# Patient Record
Sex: Male | Born: 1945 | Race: White | Hispanic: No | Marital: Married | Smoking: Never smoker
Health system: Southern US, Community
[De-identification: ages and names within clinical notes are randomized; demographics above are authoritative.]

## PROBLEM LIST (undated history)

## (undated) DIAGNOSIS — C61 Malignant neoplasm of prostate: Secondary | ICD-10-CM

## (undated) DIAGNOSIS — K5730 Diverticulosis of large intestine without perforation or abscess without bleeding: Secondary | ICD-10-CM

## (undated) DIAGNOSIS — I10 Essential (primary) hypertension: Secondary | ICD-10-CM

## (undated) DIAGNOSIS — H43813 Vitreous degeneration, bilateral: Secondary | ICD-10-CM

## (undated) DIAGNOSIS — K5190 Ulcerative colitis, unspecified, without complications: Secondary | ICD-10-CM

## (undated) HISTORY — PX: OTHER SURGICAL HISTORY: SHX169

## (undated) HISTORY — DX: Essential (primary) hypertension: I10

## (undated) HISTORY — DX: Vitreous degeneration, bilateral: H43.813

## (undated) HISTORY — PX: RETROPUBIC PROSTATECTOMY: SUR1055

## (undated) HISTORY — DX: Malignant neoplasm of prostate: C61

## (undated) HISTORY — DX: Ulcerative colitis, unspecified, without complications: K51.90

## (undated) HISTORY — DX: Diverticulosis of large intestine without perforation or abscess without bleeding: K57.30

---

## 2018-03-10 DIAGNOSIS — M818 Other osteoporosis without current pathological fracture: Secondary | ICD-10-CM

## 2018-03-10 HISTORY — DX: Other osteoporosis without current pathological fracture: M81.8

## 2020-02-25 DIAGNOSIS — H2513 Age-related nuclear cataract, bilateral: Secondary | ICD-10-CM

## 2020-02-25 HISTORY — DX: Age-related nuclear cataract, bilateral: H25.13

## 2020-03-04 DIAGNOSIS — E785 Hyperlipidemia, unspecified: Secondary | ICD-10-CM

## 2020-03-04 HISTORY — DX: Hyperlipidemia, unspecified: E78.5

## 2020-04-22 ENCOUNTER — Other Ambulatory Visit: Payer: Self-pay | Admitting: Internal Medicine

## 2020-04-22 ENCOUNTER — Other Ambulatory Visit (HOSPITAL_COMMUNITY): Payer: Self-pay | Admitting: Internal Medicine

## 2020-04-22 DIAGNOSIS — W19XXXS Unspecified fall, sequela: Secondary | ICD-10-CM

## 2020-04-22 DIAGNOSIS — R0781 Pleurodynia: Secondary | ICD-10-CM

## 2020-04-28 ENCOUNTER — Other Ambulatory Visit: Payer: Self-pay

## 2020-04-28 ENCOUNTER — Encounter (INDEPENDENT_AMBULATORY_CARE_PROVIDER_SITE_OTHER): Payer: Self-pay

## 2020-04-28 ENCOUNTER — Ambulatory Visit
Admission: RE | Admit: 2020-04-28 | Discharge: 2020-04-28 | Disposition: A | Discharge status: Home / Self Care | Payer: Medicare PPO | Source: Ambulatory Visit | Attending: Internal Medicine | Admitting: Internal Medicine

## 2020-04-28 DIAGNOSIS — I700 Atherosclerosis of aorta: Secondary | ICD-10-CM | POA: Diagnosis not present

## 2020-04-28 DIAGNOSIS — W19XXXS Unspecified fall, sequela: Secondary | ICD-10-CM | POA: Insufficient documentation

## 2020-04-28 DIAGNOSIS — R0781 Pleurodynia: Secondary | ICD-10-CM

## 2020-04-28 DIAGNOSIS — M7989 Other specified soft tissue disorders: Secondary | ICD-10-CM | POA: Insufficient documentation

## 2020-04-28 DIAGNOSIS — I2510 Atherosclerotic heart disease of native coronary artery without angina pectoris: Secondary | ICD-10-CM | POA: Diagnosis not present

## 2020-04-28 DIAGNOSIS — R609 Edema, unspecified: Secondary | ICD-10-CM | POA: Insufficient documentation

## 2020-05-18 HISTORY — PX: OTHER SURGICAL HISTORY: SHX169

## 2020-06-15 HISTORY — PX: COLONOSCOPY W/ BIOPSIES: SHX1374

## 2020-06-15 HISTORY — PX: ESOPHAGOGASTRODUODENOSCOPY: SHX1529

## 2020-11-01 ENCOUNTER — Other Ambulatory Visit: Payer: Self-pay | Admitting: Physician Assistant

## 2020-11-01 DIAGNOSIS — M25551 Pain in right hip: Secondary | ICD-10-CM

## 2020-11-25 ENCOUNTER — Ambulatory Visit: Payer: Medicare PPO

## 2020-11-26 ENCOUNTER — Other Ambulatory Visit: Payer: Self-pay | Admitting: Physician Assistant

## 2020-11-26 DIAGNOSIS — M25551 Pain in right hip: Secondary | ICD-10-CM

## 2020-11-30 ENCOUNTER — Ambulatory Visit
Admission: RE | Admit: 2020-11-30 | Discharge: 2020-11-30 | Disposition: A | Discharge status: Home / Self Care | Payer: Medicare PPO | Source: Ambulatory Visit | Attending: Physician Assistant | Admitting: Physician Assistant

## 2020-11-30 DIAGNOSIS — M25551 Pain in right hip: Secondary | ICD-10-CM | POA: Diagnosis not present

## 2021-03-22 HISTORY — PX: OTHER SURGICAL HISTORY: SHX169

## 2021-04-06 ENCOUNTER — Other Ambulatory Visit: Payer: Self-pay | Admitting: Sports Medicine

## 2021-04-06 DIAGNOSIS — M7541 Impingement syndrome of right shoulder: Secondary | ICD-10-CM

## 2021-04-06 DIAGNOSIS — M19011 Primary osteoarthritis, right shoulder: Secondary | ICD-10-CM

## 2021-04-06 DIAGNOSIS — M7551 Bursitis of right shoulder: Secondary | ICD-10-CM

## 2021-04-13 ENCOUNTER — Ambulatory Visit
Admission: RE | Admit: 2021-04-13 | Discharge: 2021-04-13 | Disposition: A | Discharge status: Home / Self Care | Payer: Medicare PPO | Source: Ambulatory Visit | Attending: Sports Medicine | Admitting: Sports Medicine

## 2021-04-13 ENCOUNTER — Other Ambulatory Visit: Payer: Self-pay

## 2021-04-13 DIAGNOSIS — M7551 Bursitis of right shoulder: Secondary | ICD-10-CM | POA: Insufficient documentation

## 2021-04-13 DIAGNOSIS — M19011 Primary osteoarthritis, right shoulder: Secondary | ICD-10-CM | POA: Diagnosis present

## 2021-04-13 DIAGNOSIS — M7541 Impingement syndrome of right shoulder: Secondary | ICD-10-CM | POA: Diagnosis not present

## 2021-06-01 ENCOUNTER — Other Ambulatory Visit: Payer: Self-pay | Admitting: Orthopedic Surgery

## 2021-06-01 ENCOUNTER — Other Ambulatory Visit: Payer: Self-pay

## 2021-06-01 ENCOUNTER — Ambulatory Visit
Admission: RE | Admit: 2021-06-01 | Discharge: 2021-06-01 | Disposition: A | Discharge status: Home / Self Care | Payer: Medicare PPO | Source: Ambulatory Visit | Attending: Orthopedic Surgery | Admitting: Orthopedic Surgery

## 2021-06-01 DIAGNOSIS — S82832A Other fracture of upper and lower end of left fibula, initial encounter for closed fracture: Secondary | ICD-10-CM | POA: Insufficient documentation

## 2021-07-18 DIAGNOSIS — L400 Psoriasis vulgaris: Secondary | ICD-10-CM

## 2021-07-18 DIAGNOSIS — L4050 Arthropathic psoriasis, unspecified: Secondary | ICD-10-CM

## 2021-07-18 HISTORY — DX: Psoriasis vulgaris: L40.0

## 2021-07-18 HISTORY — DX: Arthropathic psoriasis, unspecified: L40.50

## 2021-11-02 ENCOUNTER — Other Ambulatory Visit: Payer: Self-pay | Admitting: Rheumatology

## 2021-11-02 DIAGNOSIS — M67911 Unspecified disorder of synovium and tendon, right shoulder: Secondary | ICD-10-CM

## 2021-11-02 DIAGNOSIS — L4050 Arthropathic psoriasis, unspecified: Secondary | ICD-10-CM

## 2021-11-05 DIAGNOSIS — D649 Anemia, unspecified: Secondary | ICD-10-CM | POA: Insufficient documentation

## 2021-11-05 NOTE — Progress Notes (Signed)
?[place]  ?[phone] [phone] ? ?IDKonrad Dolores OB: [birth year]  MR#: [id number]  GQB#:[id number] ? ?Patient Care Team: ?Idelle Crouch, MD as PCP - General (Internal Medicine) ? ?CHIEF COMPLAINT: Anemia. unspecified. ? ?INTERVAL HISTORY: Patient is a 76 year old male who was noted to have a mildly declining hemoglobin recently from 13.6-12.1 on routine blood work.  He is referred for further evaluation.  He currently feels well and is asymptomatic.  He continues to walk on a daily basis.  He has no neurologic complaints.  He denies any recent fevers or illnesses.  He has a good appetite and denies weight loss.  He has no chest pain, shortness of breath, cough, or hemoptysis.  She denies any nausea, vomiting, constipation, or diarrhea.  He has no melena or hematochezia.  He has no urinary complaints.  Patient feels at his baseline and offers no specific complaints today. ? ?REVIEW OF SYSTEMS:   ?Review of Systems  ?Constitutional: Negative.  Negative for fever, malaise/fatigue and weight loss.  ?Respiratory: Negative.  Negative for cough, hemoptysis and shortness of breath.   ?Cardiovascular: Negative.  Negative for chest pain and leg swelling.  ?Gastrointestinal: Negative.  Negative for abdominal pain.  ?Genitourinary: Negative.  Negative for dysuria.  ?Musculoskeletal: Negative.  Negative for back pain.  ?Skin: Negative.  Negative for rash.  ?Neurological: Negative.  Negative for dizziness, focal weakness, weakness and headaches.  ?Psychiatric/Behavioral: Negative.  The patient is not nervous/anxious.   ? ?As per HPI. Otherwise, a complete review of systems is negative. ? ?PAST MEDICAL HISTORY: ?Past Medical History:  ?Diagnosis Date  ? Age-related nuclear cataract of both eyes 02/25/2020  ? Diverticulosis of sigmoid colon   ? Hyperlipidemia 03/04/2020  ? Hypertension   ? Osteoporosis, idiopathic 03/10/2018  ? 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 Posterior vitreous  detachment of both eyes   ? Prostate cancer ([place])   ? Psoriatic arthritis ([place]) 07/18/2021  ? Ulcerative colitis ([place])   ? ? ?PAST SURGICAL HISTORY: ?Past Surgical History:  ?Procedure Laterality Date  ? COLONOSCOPY W/ BIOPSIES  06/15/2020  ? Enthesopathy of right hip  03/22/2021  ? ESOPHAGOGASTRODUODENOSCOPY  06/15/2020  ? history of adenomatous colonic polyps  05/18/2020  ? Rectal fissure repair    ? RETROPUBIC PROSTATECTOMY    ? ? ?FAMILY HISTORY: ?Family History  ?Problem Relation Age of Onset  ? Diabetes Mother   ? Heart disease Mother   ? Arthritis Mother   ? Cancer Father   ?     bladder cancer  ? Diabetes Brother   ? Cancer Brother   ?     Cancer of pharynx  ? Arthritis Maternal Grandmother   ? Arthritis Maternal Grandfather   ? Arthritis Paternal Grandmother   ? Arthritis Paternal Grandfather   ? ? ?ADVANCED DIRECTIVES (Y/N):  N ? ?HEALTH MAINTENANCE: ?Social History  ? ?Tobacco Use  ? Smoking status: Never  ?Substance Use Topics  ? Alcohol use: Not Currently  ? Drug use: Never  ? ? ? Colonoscopy: ? PAP: ? Bone density: ? Lipid panel: ? ?No Known Allergies ? ?Current Outpatient Medications  ?Medication Sig Dispense Refill  ? amLODipine (NORVASC) 10 MG tablet Take 1 tablet by mouth daily.    ? atorvastatin (LIPITOR) 20 MG tablet Take 1 tablet by mouth daily.    ? famotidine (PEPCID) 20 MG tablet TAKE 1 TABLET BY MOUTH TWICE DAILY AS NEEDED FOR HEARTBURN    ? folic acid (FOLVITE) 1 MG tablet Take  by mouth.    ? mesalamine (LIALDA) 1.2 g EC tablet Take by mouth.    ? metFORMIN (GLUCOPHAGE) 1000 MG tablet Take by mouth.    ? methotrexate (RHEUMATREX) 2.5 MG tablet Take by mouth.    ? Multiple Vitamin (MULTI-VITAMIN) tablet Take 1 tablet by mouth daily.    ? omeprazole (PRILOSEC) 40 MG capsule TAKE 1 CAPSULE(40 MG) BY MOUTH EVERY DAY 20 TO 30 MINUTES BEFORE BREAKFAST    ? ?No current facility-administered medications for this visit.  ? ? ?OBJECTIVE: ?Vitals:  ? 11/08/21 1104  ?BP: 129/83  ?Pulse: 88  ?Resp: 16   ?Temp: 98.1 ?F (36.7 ?C)  ?SpO2: 100%  ?   Body mass index is 25.34 kg/m?Marland Kitchen    ECOG FS:0 - Asymptomatic ? ?General: Well-developed, well-nourished, no acute distress. ?Eyes: Pink conjunctiva, anicteric sclera. ?HEENT: Normocephalic, moist mucous membranes. ?Lungs: No audible wheezing or coughing. ?Heart: Regular rate and rhythm. ?Abdomen: Soft, nontender, no obvious distention. ?Musculoskeletal: No edema, cyanosis, or clubbing. ?Neuro: Alert, answering all questions appropriately. Cranial nerves grossly intact. ?Skin: No rashes or petechiae noted. ?Psych: Normal affect. ?Lymphatics: No cervical, calvicular, axillary or inguinal LAD. ? ? ?LAB RESULTS: ? ?No results found for: NA, K, CL, CO2, GLUCOSE, BUN, CREATININE, CALCIUM, PROT, ALBUMIN, AST, ALT, ALKPHOS, BILITOT, GFRNONAA, GFRAA ? ?Lab Results  ?Component Value Date  ? WBC 6.2 11/08/2021  ? HGB 12.5 (L) 11/08/2021  ? HCT 37.0 (L) 11/08/2021  ? MCV 93.9 11/08/2021  ? PLT 226 11/08/2021  ? ? ? ?STUDIES: ?No results found. ? ?ASSESSMENT: Anemia. unspecified. ? ?PLAN:   ? ?Anemia, unspecified: Patient reports he initiated methotrexate for arthritis approximately 2-1/2 months ago.  This could be the possible etiology of his declining hemoglobin.  Today's result is 12.5 which is essentially unchanged.  The remainder of his laboratory work including B12, folate, iron stores, hemolysis labs, SPEP are pending at time of dictation.  No intervention is needed at this time.  Patient does not require bone marrow biopsy.  A video visit was scheduled for 3 weeks to discuss the results, but if everything returns normal this may be canceled. ? ?I spent a total of 45 minutes reviewing chart data, face-to-face evaluation with the patient, counseling and coordination of care as detailed above. ? ? ?Patient expressed understanding and was in agreement with this plan. He also understands that He can call clinic at any time with any questions, concerns, or complaints.  ? ? ?Lloyd Huger, MD   11/08/2021 12:27 PM ? ? ? ? ?

## 2021-11-08 ENCOUNTER — Inpatient Hospital Stay: Payer: Medicare PPO | Attending: Oncology

## 2021-11-08 ENCOUNTER — Encounter: Payer: Self-pay | Admitting: Oncology

## 2021-11-08 ENCOUNTER — Inpatient Hospital Stay: Payer: Medicare PPO | Admitting: Oncology

## 2021-11-08 DIAGNOSIS — Z8546 Personal history of malignant neoplasm of prostate: Secondary | ICD-10-CM | POA: Diagnosis not present

## 2021-11-08 DIAGNOSIS — D649 Anemia, unspecified: Secondary | ICD-10-CM

## 2021-11-08 DIAGNOSIS — Z7984 Long term (current) use of oral hypoglycemic drugs: Secondary | ICD-10-CM | POA: Insufficient documentation

## 2021-11-08 DIAGNOSIS — Z79899 Other long term (current) drug therapy: Secondary | ICD-10-CM | POA: Insufficient documentation

## 2021-11-08 LAB — LACTATE DEHYDROGENASE: LDH: 154 U/L (ref 98–192)

## 2021-11-08 LAB — CBC
HCT: 37 % — ABNORMAL LOW (ref 39.0–52.0)
Hemoglobin: 12.5 g/dL — ABNORMAL LOW (ref 13.0–17.0)
MCH: 31.7 pg (ref 26.0–34.0)
MCHC: 33.8 g/dL (ref 30.0–36.0)
MCV: 93.9 fL (ref 80.0–100.0)
Platelets: 226 10*3/uL (ref 150–400)
RBC: 3.94 MIL/uL — ABNORMAL LOW (ref 4.22–5.81)
RDW: 14.2 % (ref 11.5–15.5)
WBC: 6.2 10*3/uL (ref 4.0–10.5)
nRBC: 0 % (ref 0.0–0.2)

## 2021-11-08 LAB — FERRITIN: Ferritin: 38 ng/mL (ref 24–336)

## 2021-11-08 LAB — IRON AND TIBC
Iron: 46 ug/dL (ref 45–182)
Saturation Ratios: 15 % — ABNORMAL LOW (ref 17.9–39.5)
TIBC: 316 ug/dL (ref 250–450)
UIBC: 270 ug/dL

## 2021-11-08 LAB — VITAMIN B12: Vitamin B-12: 778 pg/mL (ref 180–914)

## 2021-11-08 LAB — FOLATE: Folate: >100 ng/mL (ref >5.9)

## 2021-11-09 LAB — HAPTOGLOBIN: Haptoglobin: 45 mg/dL (ref 34–355)

## 2021-11-10 ENCOUNTER — Ambulatory Visit
Admission: RE | Admit: 2021-11-10 | Discharge: 2021-11-10 | Disposition: A | Discharge status: Home / Self Care | Payer: Medicare PPO | Source: Ambulatory Visit | Attending: Rheumatology | Admitting: Rheumatology

## 2021-11-10 DIAGNOSIS — M67911 Unspecified disorder of synovium and tendon, right shoulder: Secondary | ICD-10-CM | POA: Insufficient documentation

## 2021-11-10 DIAGNOSIS — L4050 Arthropathic psoriasis, unspecified: Secondary | ICD-10-CM | POA: Insufficient documentation

## 2021-11-10 LAB — PROTEIN ELECTROPHORESIS, SERUM
A/G Ratio: 1.5 (ref 0.7–1.7)
Albumin ELP: 4.4 g/dL (ref 2.9–4.4)
Alpha-1-Globulin: 0.3 g/dL (ref 0.0–0.4)
Alpha-2-Globulin: 0.7 g/dL (ref 0.4–1.0)
Beta Globulin: 0.9 g/dL (ref 0.7–1.3)
Gamma Globulin: 1.1 g/dL (ref 0.4–1.8)
Globulin, Total: 2.9 g/dL (ref 2.2–3.9)
Total Protein ELP: 7.3 g/dL (ref 6.0–8.5)

## 2021-11-25 NOTE — Progress Notes (Unsigned)
[place]  Telephone:(336) [phone] [phone]  ID: Todd Patrick OB: [birth year]  MR#: [id number]  VOJ#:[id number]  Patient Care Team: Idelle Crouch, MD as PCP - General (Internal Medicine)  CHIEF COMPLAINT: Mild iron deficiency anemia.  INTERVAL HISTORY: Patient returns to clinic today for further evaluation and discussion of his laboratory results.  He continues to feel well and remains asymptomatic. He has no neurologic complaints.  He denies any recent fevers or illnesses.  He has a good appetite and denies weight loss.  He has no chest pain, shortness of breath, cough, or hemoptysis.  She denies any nausea, vomiting, constipation, or diarrhea.  He has no melena or hematochezia.  He has no urinary complaints.  Patient offers no specific complaints today.  REVIEW OF SYSTEMS:   Review of Systems  Constitutional: Negative.  Negative for fever, malaise/fatigue and weight loss.  Respiratory: Negative.  Negative for cough, hemoptysis and shortness of breath.   Cardiovascular: Negative.  Negative for chest pain and leg swelling.  Gastrointestinal: Negative.  Negative for abdominal pain.  Genitourinary: Negative.  Negative for dysuria.  Musculoskeletal: Negative.  Negative for back pain.  Skin: Negative.  Negative for rash.  Neurological: Negative.  Negative for dizziness, focal weakness, weakness and headaches.  Psychiatric/Behavioral: Negative.  The patient is not nervous/anxious.    As per HPI. Otherwise, a complete review of systems is negative.  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   Posterior vitreous detachment of both eyes    Prostate cancer ([place])    Psoriatic arthritis ([place]) 07/18/2021   Ulcerative colitis ([place])     PAST  SURGICAL HISTORY: Past Surgical History:  Procedure Laterality Date   COLONOSCOPY W/ BIOPSIES  06/15/2020   Enthesopathy of right hip  03/22/2021   ESOPHAGOGASTRODUODENOSCOPY  06/15/2020   history of adenomatous colonic polyps  05/18/2020   Rectal fissure repair     RETROPUBIC PROSTATECTOMY      FAMILY HISTORY: Family History  Problem Relation Age of Onset   Diabetes Mother    Heart disease Mother    Arthritis Mother    Cancer Father        bladder cancer   Diabetes Brother    Cancer Brother        Cancer of pharynx   Arthritis Maternal Grandmother    Arthritis Maternal Grandfather    Arthritis Paternal Grandmother    Arthritis Paternal Grandfather     ADVANCED DIRECTIVES (Y/N):  N  HEALTH MAINTENANCE: Social History   Tobacco Use   Smoking status: Never  Substance Use Topics   Alcohol use: Not Currently   Drug use: Never     Colonoscopy:  PAP:  Bone density:  Lipid panel:  No Known Allergies  Current Outpatient Medications  Medication Sig Dispense Refill   amLODipine (NORVASC) 10 MG tablet Take 1 tablet by mouth daily.     atorvastatin (LIPITOR) 20 MG tablet Take 1 tablet by mouth daily.     folic acid (FOLVITE) 1 MG tablet Take by mouth.     mesalamine (LIALDA) 1.2 g EC tablet Take by mouth.     metFORMIN (GLUCOPHAGE) 1000 MG tablet Take by mouth.     methotrexate (RHEUMATREX) 2.5 MG tablet Take by mouth.     Multiple Vitamin (MULTI-VITAMIN)  tablet Take 1 tablet by mouth daily.     omeprazole (PRILOSEC) 40 MG capsule TAKE 1 CAPSULE(40 MG) BY MOUTH EVERY DAY 20 TO 30 MINUTES BEFORE BREAKFAST     famotidine (PEPCID) 20 MG tablet TAKE 1 TABLET BY MOUTH TWICE DAILY AS NEEDED FOR HEARTBURN (Patient not taking: Reported on 12/01/2021)     No current facility-administered medications for this visit.    OBJECTIVE: Vitals:   12/01/21 1028  BP: 132/69  Pulse: 86  Resp: 18  Temp: 98.9 F (37.2 C)  SpO2: 100%     Body mass index is 25.7 kg/m.    ECOG FS:0 -  Asymptomatic  General: Well-developed, well-nourished, no acute distress. Eyes: Pink conjunctiva, anicteric sclera. HEENT: Normocephalic, moist mucous membranes. Lungs: No audible wheezing or coughing. Heart: Regular rate and rhythm. Abdomen: Soft, nontender, no obvious distention. Musculoskeletal: No edema, cyanosis, or clubbing. Neuro: Alert, answering all questions appropriately. Cranial nerves grossly intact. Skin: No rashes or petechiae noted. Psych: Normal affect.   LAB RESULTS:  No results found for: NA, K, CL, CO2, GLUCOSE, BUN, CREATININE, CALCIUM, PROT, ALBUMIN, AST, ALT, ALKPHOS, BILITOT, GFRNONAA, GFRAA  Lab Results  Component Value Date   WBC 6.2 11/08/2021   HGB 12.5 (L) 11/08/2021   HCT 37.0 (L) 11/08/2021   MCV 93.9 11/08/2021   PLT 226 11/08/2021   Lab Results  Component Value Date   IRON 46 11/08/2021   TIBC 316 11/08/2021   IRONPCTSAT 15 (L) 11/08/2021   Lab Results  Component Value Date   FERRITIN 38 11/08/2021     STUDIES: MR SHOULDER RIGHT WO CONTRAST  Result Date: 11/12/2021 CLINICAL DATA:  Chronic right shoulder pain.  Worse at night. EXAM: MRI OF THE RIGHT SHOULDER WITHOUT CONTRAST TECHNIQUE: Multiplanar, multisequence MR imaging of the shoulder was performed. No intravenous contrast was administered. COMPARISON:  MRI right shoulder 04/13/2021 FINDINGS: Rotator cuff: There is again diffuse intermediate T2 signal supraspinatus and infraspinatus tendinosis, greatest and mild-to-moderate at the far anterior supraspinatus tendon footprint. There is again mild degenerative fraying of the bursal side of the tendon footprint of the interdigitating posterior supraspinatus and anterior infraspinatus. New non fluid-bright intermediate T2 signal linear likely small partial-thickness articular sided tear of the proximal footprint of the mid AP dimension of the supraspinatus tendon (coronal series 7 image 15). No tendon retraction. The subscapularis and teres  minor are intact. Muscles: No rotator cuff muscle atrophy, fatty infiltration, or edema. Biceps long head: Moderate intermediate T2 signal and thickening proximal long head of the biceps tendinosis proximal to the bicipital groove, unchanged. Acromioclavicular Joint: There are mild-to-moderate degenerative changes of the acromioclavicular joint including joint space narrowing, subchondral marrow edema, and peripheral osteophytosis. Type II acromion. Mild subacromial/subdeltoid bursitis. Glenohumeral Joint: Mild thinning of the glenoid and humeral head cartilage. Labrum: Degenerative irregularity and likely tearing of the posterosuperior glenoid labrum, similar to prior. Bones:  No acute fracture. Other: None. IMPRESSION: Compared to 04/13/2021: 1. Redemonstration of supraspinatus greater than infraspinatus tendinosis, moderate at the anterior supraspinatus tendon footprint. New tiny linear partial-thickness articular sided tear of the mid AP dimension of the supraspinatus tendon footprint. No tendon retraction. 2. Moderate proximal long head of the biceps tendinosis, unchanged. 3. Mild-to-moderate degenerative changes of the acromioclavicular joint. 4. 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 Electronically Signed   By: Yvonne Kendall M.D.   On: 11/12/2021 12:11    ASSESSMENT: Mild iron deficiency.  PLAN:    Mild iron deficiency: Patient's most  recent hemoglobin is 12.5 with a mildly decreased iron saturation of 15%.  All of his other laboratory work was either negative or within normal limits.  Patient also recently started methotrexate which may be contributing to his anemia.  No intervention is needed at this time.  Patient does not require bone marrow biopsy.  I recommended oral iron supplementation once per day and have his primary care provider repeat laboratory work in 3 to 4 months.  No further follow-up is necessary.  Please refer patient back  if there are any questions or concerns.    I spent a total of 20 minutes reviewing chart data, face-to-face evaluation with the patient, counseling and coordination of care as detailed above.    Patient expressed understanding and was in agreement with this plan. He also understands that He can call clinic at any time with any questions, concerns, or complaints.    Lloyd Huger, MD   12/01/2021 1:52 PM

## 2021-12-01 ENCOUNTER — Encounter: Payer: Self-pay | Admitting: Oncology

## 2021-12-01 ENCOUNTER — Telehealth: Payer: Medicare PPO | Admitting: Oncology

## 2021-12-01 ENCOUNTER — Inpatient Hospital Stay: Payer: Medicare PPO | Attending: Oncology | Admitting: Oncology

## 2021-12-01 VITALS — BP 132/69 | HR 86 | Temp 98.9°F | Resp 18 | Wt 179.1 lb

## 2021-12-01 DIAGNOSIS — Z79899 Other long term (current) drug therapy: Secondary | ICD-10-CM | POA: Diagnosis not present

## 2021-12-01 DIAGNOSIS — Z8052 Family history of malignant neoplasm of bladder: Secondary | ICD-10-CM | POA: Insufficient documentation

## 2021-12-01 DIAGNOSIS — Z808 Family history of malignant neoplasm of other organs or systems: Secondary | ICD-10-CM | POA: Diagnosis not present

## 2021-12-01 DIAGNOSIS — D509 Iron deficiency anemia, unspecified: Secondary | ICD-10-CM | POA: Insufficient documentation

## 2021-12-01 DIAGNOSIS — D649 Anemia, unspecified: Secondary | ICD-10-CM

## 2022-03-23 ENCOUNTER — Encounter: Payer: Self-pay | Admitting: Oncology

## 2022-03-23 NOTE — Telephone Encounter (Signed)
Spoke with patient in regards to my chart messages. He stated that he has iron infusions set up through Dr. Doy Hutching and will call back if needed for future infusions.

## 2022-10-25 IMAGING — MR MR SHOULDER*R* W/O CM
4 of 5 series · 31 of 40 positions shown · non-contrast
Comparison: MRI right shoulder 04/13/2021

CLINICAL DATA: Chronic right shoulder pain.  Worse at night.

EXAM:
MRI OF THE RIGHT SHOULDER WITHOUT CONTRAST
TECHNIQUE: Multiplanar, multisequence MR imaging of the shoulder was performed.
No intravenous contrast was administered.

[Series 5: T2 fat-sat · axial · right · 4.0mm · 0.44mm/px · z∈[-24,+84]mm · 8 of 26 slices shown (1 of 3)]
[im 1/26]
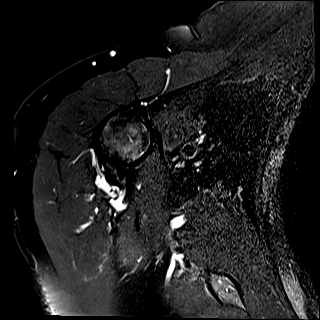
[im 4/26]
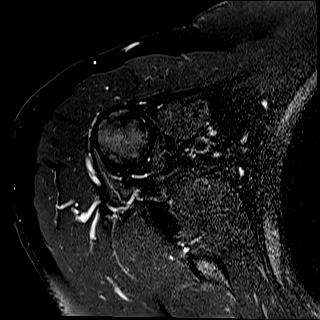
[im 8/26]
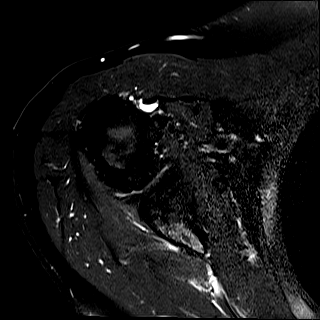
[im 11/26]
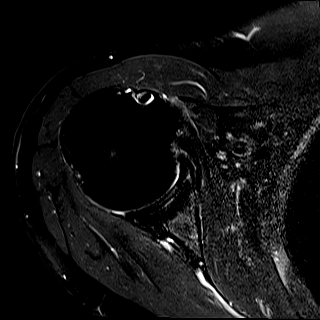
[im 15/26]
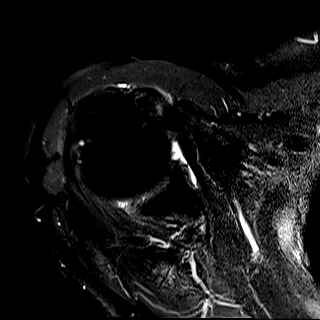
[im 18/26]
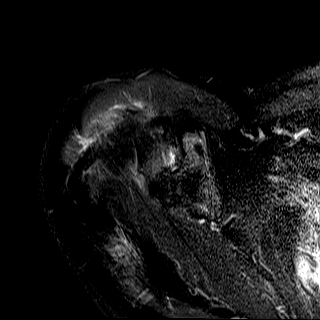
[im 22/26]
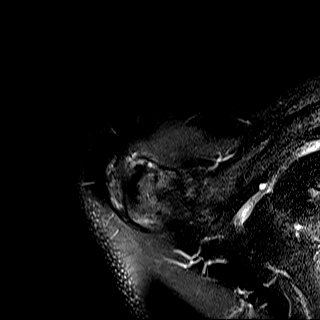
[im 26/26]
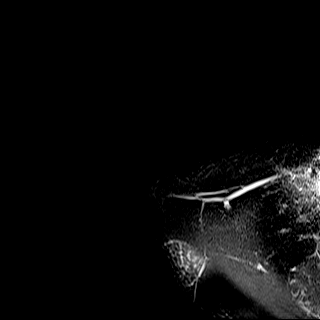

[Series 6: PD · oblique · right · 4.0mm · 0.44mm/px · 9 of 26 slices shown]
[im 1/26]
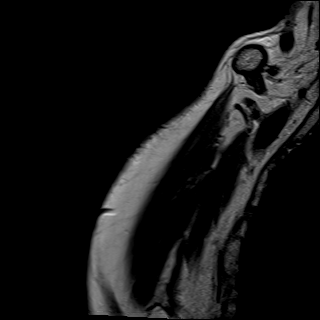
[im 4/26]
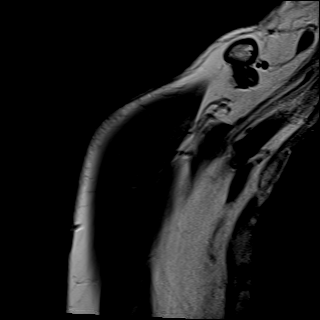
[im 7/26]
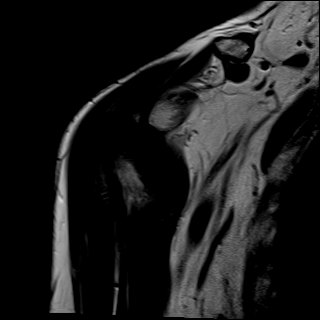
[im 10/26]
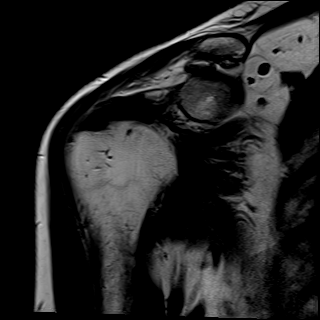
[im 13/26]
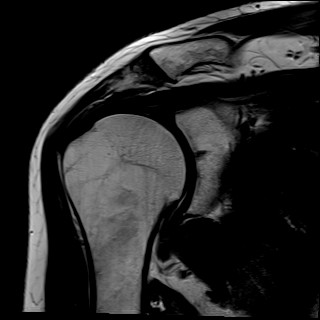
[im 16/26]
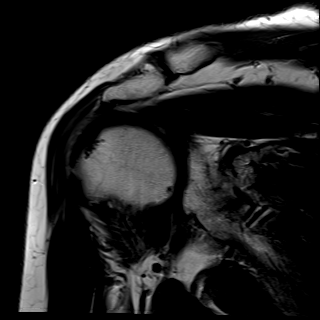
[im 19/26]
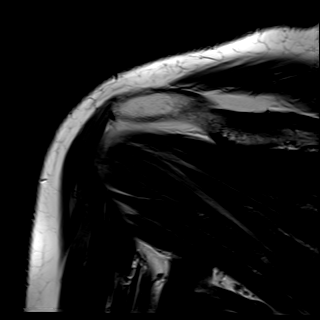
[im 22/26]
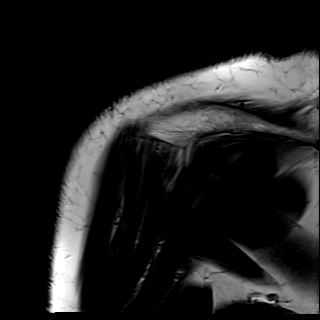
[im 26/26]
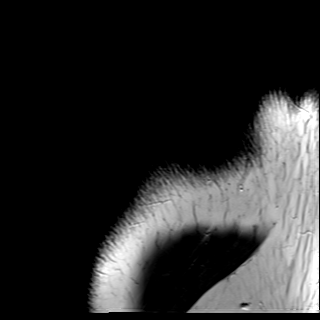

[Series 7: T2 fat-sat · oblique · right · 4.0mm · 0.44mm/px · 9 of 26 slices shown (2 of 3)]
[im 1/26]
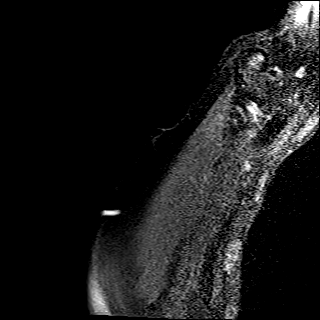
[im 4/26]
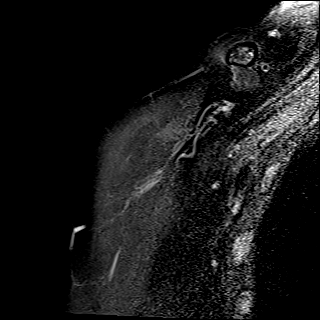
[im 7/26]
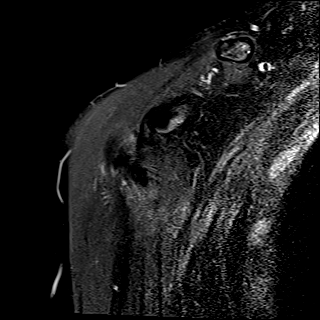
[im 10/26]
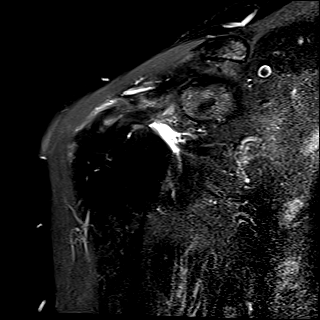
[im 13/26]
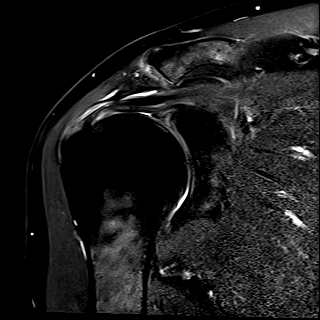
[im 16/26]
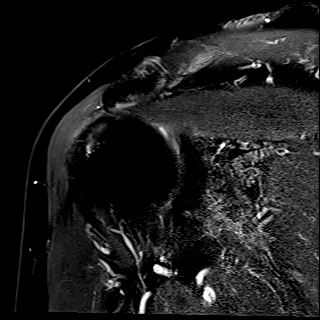
[im 19/26]
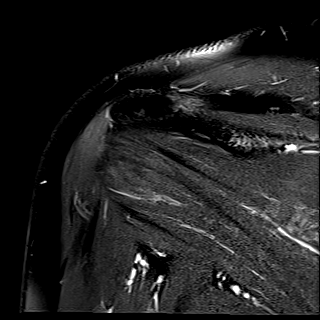
[im 22/26]
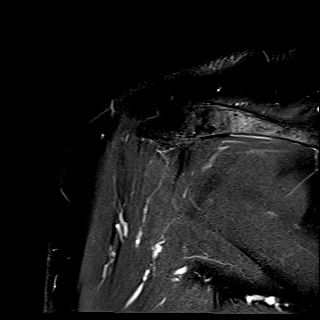
[im 26/26]
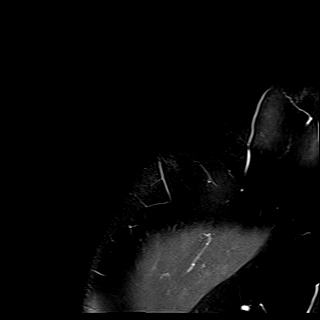

[Series 8: T2 fat-sat · oblique · right · 4.0mm · 0.22mm/px · 5 of 22 slices shown (3 of 3)]
[im 1/22]
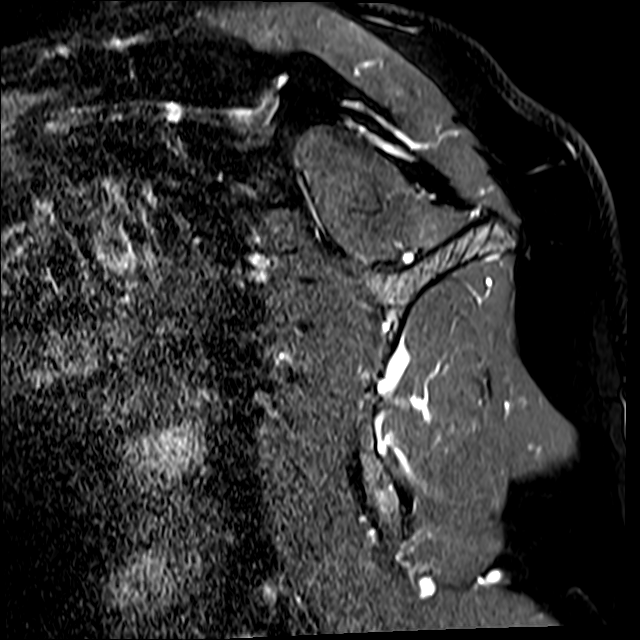
[im 4/22]
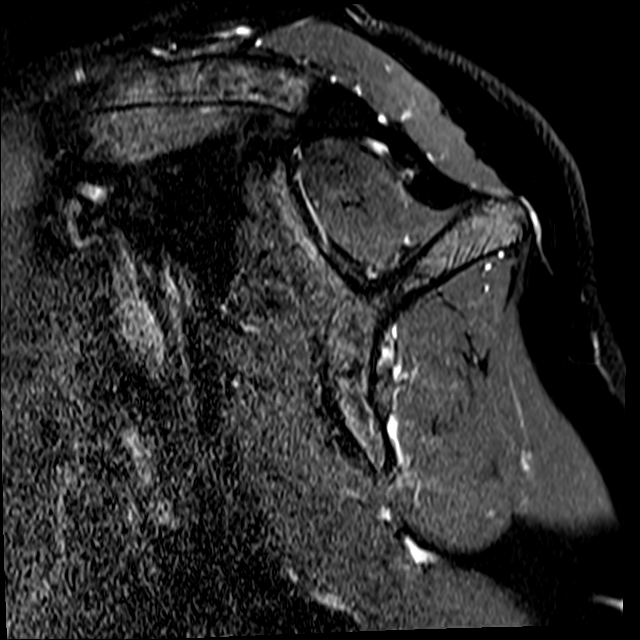
[im 8/22]
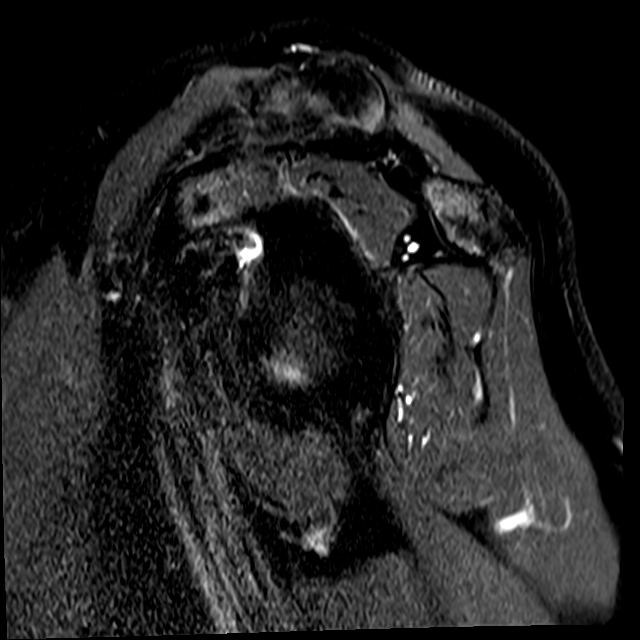
[im 11/22]
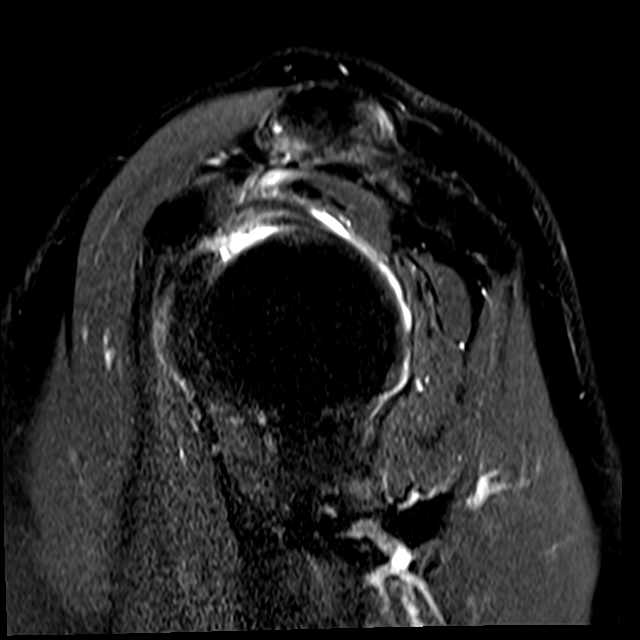
[im 18/22]
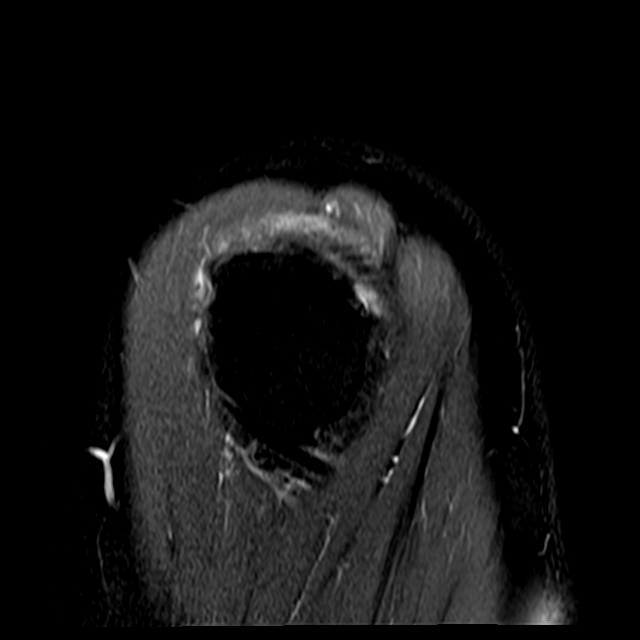

[31 of 40 positions shown; findings below may reference images not displayed]

FINDINGS: Rotator cuff: There is again diffuse intermediate T2 signal
supraspinatus and infraspinatus tendinosis, greatest and
mild-to-moderate at the far anterior supraspinatus tendon footprint.
There is again mild degenerative fraying of the bursal side of the
tendon footprint of the interdigitating posterior supraspinatus and
anterior infraspinatus. New non fluid-bright intermediate T2 signal
linear likely small partial-thickness articular sided tear of the
proximal footprint of the mid AP dimension of the supraspinatus
tendon (coronal series 7 image 15). No tendon retraction. The
subscapularis and teres minor are intact.

Muscles: No rotator cuff muscle atrophy, fatty infiltration, or
edema.

Biceps long head: Moderate intermediate T2 signal and thickening
proximal long head of the biceps tendinosis proximal to the
bicipital groove, unchanged.

Acromioclavicular Joint: There are mild-to-moderate degenerative
changes of the acromioclavicular joint including joint space
narrowing, subchondral marrow edema, and peripheral osteophytosis.
Type II acromion. Mild subacromial/subdeltoid bursitis.

Glenohumeral Joint: Mild thinning of the glenoid and humeral head
cartilage.

Labrum: Degenerative irregularity and likely tearing of the
posterosuperior glenoid labrum, similar to prior.

Bones:  No acute fracture.

Other: None.
IMPRESSION: Compared to 04/13/2021:

1. Redemonstration of supraspinatus greater than infraspinatus
tendinosis, moderate at the anterior supraspinatus tendon footprint.
New tiny linear partial-thickness articular sided tear of the mid AP
dimension of the supraspinatus tendon footprint. No tendon
retraction.
2. Moderate proximal long head of the biceps tendinosis, unchanged.
3. Mild-to-moderate degenerative changes of the acromioclavicular
joint.
4. Mild glenohumeral osteoarthritis with degenerative changes of the
posterosuperior labrum, unchanged.

## 2022-12-18 ENCOUNTER — Ambulatory Visit
Admission: RE | Admit: 2022-12-18 | Discharge: 2022-12-18 | Disposition: A | Discharge status: Home / Self Care | Payer: Medicare PPO | Source: Ambulatory Visit | Attending: Internal Medicine | Admitting: Internal Medicine

## 2022-12-18 ENCOUNTER — Other Ambulatory Visit: Payer: Self-pay

## 2022-12-18 DIAGNOSIS — R911 Solitary pulmonary nodule: Secondary | ICD-10-CM | POA: Diagnosis present

## 2023-01-03 ENCOUNTER — Other Ambulatory Visit: Payer: Self-pay | Admitting: Internal Medicine

## 2023-01-03 ENCOUNTER — Ambulatory Visit
Admission: RE | Admit: 2023-01-03 | Discharge: 2023-01-03 | Disposition: A | Discharge status: Home / Self Care | Payer: Medicare PPO | Source: Ambulatory Visit | Attending: Internal Medicine | Admitting: Internal Medicine

## 2023-01-03 ENCOUNTER — Other Ambulatory Visit: Payer: Self-pay

## 2023-01-03 DIAGNOSIS — R1031 Right lower quadrant pain: Secondary | ICD-10-CM | POA: Insufficient documentation

## 2023-01-03 DIAGNOSIS — N23 Unspecified renal colic: Secondary | ICD-10-CM

## 2023-02-01 ENCOUNTER — Ambulatory Visit: Payer: Medicare PPO | Admitting: Urology

## 2023-02-16 ENCOUNTER — Ambulatory Visit: Payer: Medicare PPO | Admitting: Urology

## 2023-02-16 VITALS — BP 129/86 | HR 92 | Ht 71.0 in | Wt 180.0 lb

## 2023-02-16 DIAGNOSIS — N200 Calculus of kidney: Secondary | ICD-10-CM

## 2023-02-16 LAB — URINALYSIS, COMPLETE
Bilirubin, UA: NEGATIVE
Glucose, UA: NEGATIVE
Leukocytes,UA: NEGATIVE
Nitrite, UA: NEGATIVE
Protein,UA: NEGATIVE
RBC, UA: NEGATIVE
Specific Gravity, UA: 1.025 (ref 1.005–1.030)
Urobilinogen, Ur: 0.2 mg/dL (ref 0.2–1.0)
pH, UA: 5 (ref 5.0–7.5)

## 2023-02-16 LAB — MICROSCOPIC EXAMINATION

## 2023-02-16 NOTE — Progress Notes (Signed)
I was on call today and running behind.  Patient left without being seen

## 2023-06-22 ENCOUNTER — Other Ambulatory Visit: Payer: Self-pay | Admitting: Physician Assistant

## 2023-06-22 ENCOUNTER — Ambulatory Visit
Admission: RE | Admit: 2023-06-22 | Discharge: 2023-06-22 | Disposition: A | Discharge status: Home / Self Care | Payer: Medicare PPO | Source: Ambulatory Visit | Attending: Physician Assistant | Admitting: Physician Assistant

## 2023-06-22 DIAGNOSIS — K1120 Sialoadenitis, unspecified: Secondary | ICD-10-CM

## 2023-06-22 DIAGNOSIS — R6884 Jaw pain: Secondary | ICD-10-CM | POA: Insufficient documentation

## 2023-06-22 MED ORDER — GADOBUTROL 1 MMOL/ML IV SOLN
7.5000 mL | Freq: Once | INTRAVENOUS | Status: AC | PRN
  Administered 2023-06-22: 7.5 mL via INTRAVENOUS

## 2024-01-14 ENCOUNTER — Encounter: Payer: Self-pay | Admitting: Internal Medicine

## 2024-01-14 ENCOUNTER — Telehealth: Payer: Self-pay | Admitting: Oncology

## 2024-01-14 NOTE — Telephone Encounter (Signed)
 Patient called to request follow up appointment.  Last seen 12/01/21 no follow up needed. Please advise how to schedule patient   Call back  [phone]

## 2024-01-15 ENCOUNTER — Ambulatory Visit: Payer: Medicare PPO | Admitting: Dermatology

## 2024-02-11 ENCOUNTER — Other Ambulatory Visit: Payer: Self-pay | Admitting: *Deleted

## 2024-02-11 DIAGNOSIS — D649 Anemia, unspecified: Secondary | ICD-10-CM

## 2024-02-12 ENCOUNTER — Inpatient Hospital Stay

## 2024-02-12 ENCOUNTER — Inpatient Hospital Stay: Admitting: Oncology

## 2024-02-12 ENCOUNTER — Encounter: Payer: Self-pay | Admitting: Oncology

## 2024-02-12 ENCOUNTER — Inpatient Hospital Stay: Attending: Oncology

## 2024-02-12 VITALS — BP 136/86 | HR 84 | Temp 99.0°F | Resp 18 | Wt 178.0 lb

## 2024-02-12 VITALS — BP 131/85 | HR 84 | Temp 98.9°F | Resp 18

## 2024-02-12 DIAGNOSIS — D649 Anemia, unspecified: Secondary | ICD-10-CM

## 2024-02-12 DIAGNOSIS — D509 Iron deficiency anemia, unspecified: Secondary | ICD-10-CM | POA: Diagnosis present

## 2024-02-12 LAB — CBC WITH DIFFERENTIAL/PLATELET
Abs Immature Granulocytes: 0.02 K/uL (ref 0.00–0.07)
Basophils Absolute: 0 K/uL (ref 0.0–0.1)
Basophils Relative: 1 %
Eosinophils Absolute: 0.1 K/uL (ref 0.0–0.5)
Eosinophils Relative: 2 %
HCT: 38.9 % — ABNORMAL LOW (ref 39.0–52.0)
Hemoglobin: 13.4 g/dL (ref 13.0–17.0)
Immature Granulocytes: 0 %
Lymphocytes Relative: 32 %
Lymphs Abs: 2 K/uL (ref 0.7–4.0)
MCH: 31.5 pg (ref 26.0–34.0)
MCHC: 34.4 g/dL (ref 30.0–36.0)
MCV: 91.5 fL (ref 80.0–100.0)
Monocytes Absolute: 0.6 K/uL (ref 0.1–1.0)
Monocytes Relative: 10 %
Neutro Abs: 3.4 K/uL (ref 1.7–7.7)
Neutrophils Relative %: 55 %
Platelets: 162 K/uL (ref 150–400)
RBC: 4.25 MIL/uL (ref 4.22–5.81)
RDW: 12.9 % (ref 11.5–15.5)
WBC: 6.2 K/uL (ref 4.0–10.5)
nRBC: 0 % (ref 0.0–0.2)

## 2024-02-12 LAB — IRON AND TIBC
Iron: 89 ug/dL (ref 45–182)
Saturation Ratios: 27 % (ref 17.9–39.5)
TIBC: 328 ug/dL (ref 250–450)
UIBC: 239 ug/dL

## 2024-02-12 LAB — FERRITIN: Ferritin: 13 ng/mL — ABNORMAL LOW (ref 24–336)

## 2024-02-12 MED ORDER — IRON SUCROSE 20 MG/ML IV SOLN
200.0000 mg | Freq: Once | INTRAVENOUS | Status: AC
  Administered 2024-02-12 (×2): 200 mg via INTRAVENOUS
  Filled 2024-02-12: qty 10

## 2024-02-12 MED ORDER — SODIUM CHLORIDE 0.9% FLUSH
10.0000 mL | Freq: Once | INTRAVENOUS | Status: AC | PRN
Start: 2024-02-12 — End: 2024-02-12
  Administered 2024-02-12 (×2): 10 mL
  Filled 2024-02-12: qty 10

## 2024-02-12 NOTE — Progress Notes (Signed)
 Patient is doing ok, he is having some fatigue, a lack of appetite.

## 2024-02-12 NOTE — Progress Notes (Signed)
 [facility]  Telephone:(336) [phone] [phone]) [phone]  ID: Todd Patrick OB: June 28, 1946  MR#: [id number]  RDW#:[id number]  Patient Care Team: Sherial Bail, MD as PCP - General (Internal Medicine) Jacobo Todd PARAS, MD as Consulting Physician (Oncology)  CHIEF COMPLAINT: Mild iron  deficiency anemia.  INTERVAL HISTORY: Patient last seen in clinic on December 01, 2021.  He is referred back for declining iron  stores.  He feels fatigued, but otherwise feels well. He has no neurologic complaints.  He denies any recent fevers or illnesses.  He has a good appetite and denies weight loss.  He has no chest pain, shortness of breath, cough, or hemoptysis.  She denies any nausea, vomiting, constipation, or diarrhea.  He has no melena or hematochezia.  He has no urinary complaints.  Patient offers no further specific complaints today.  REVIEW OF SYSTEMS:   Review of Systems  Constitutional:  Positive for malaise/fatigue. Negative for fever and weight loss.  Respiratory: Negative.  Negative for cough, hemoptysis and shortness of breath.   Cardiovascular: Negative.  Negative for chest pain and leg swelling.  Gastrointestinal: Negative.  Negative for abdominal pain.  Genitourinary: Negative.  Negative for dysuria.  Musculoskeletal: Negative.  Negative for back pain.  Skin: Negative.  Negative for rash.  Neurological: Negative.  Negative for dizziness, focal weakness, weakness and headaches.  Psychiatric/Behavioral: Negative.  The patient is not nervous/anxious.     As per HPI. Otherwise, a complete review of systems is negative.  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   Ulcerative colitis ([facility])     PAST SURGICAL HISTORY: Past Surgical History:  Procedure Laterality Date   COLONOSCOPY W/ BIOPSIES  06/15/2020   Enthesopathy of right hip  03/22/2021   ESOPHAGOGASTRODUODENOSCOPY  06/15/2020   history of adenomatous colonic polyps  05/18/2020   Rectal fissure repair     RETROPUBIC PROSTATECTOMY      FAMILY HISTORY: Family History  Problem Relation Age of Onset   Diabetes Mother    Heart disease Mother    Arthritis Mother    Cancer Father        bladder cancer   Diabetes Brother    Cancer Brother        Cancer of pharynx   Arthritis Maternal Grandmother    Arthritis Maternal Grandfather    Arthritis Paternal Grandmother    Arthritis Paternal Grandfather     ADVANCED DIRECTIVES (Y/N):  N  HEALTH MAINTENANCE: Social History   Tobacco Use   Smoking status: Never  Substance Use Topics   Alcohol use: Not Currently   Drug use: Never     Colonoscopy:  PAP:  Bone density:  Lipid panel:  No Known Allergies  Current Outpatient Medications  Medication Sig Dispense Refill   amLODipine (NORVASC) 10 MG tablet Take 1 tablet by mouth daily.     atorvastatin (LIPITOR) 20 MG tablet Take 1 tablet by mouth daily.     famotidine (PEPCID) 20 MG tablet      mesalamine (LIALDA) 1.2 g EC tablet Take by mouth.     metFORMIN (GLUCOPHAGE) 1000 MG tablet Take by mouth.     methotrexate (RHEUMATREX) 2.5 MG tablet  Take by mouth.     Multiple Vitamin (MULTI-VITAMIN) tablet Take 1 tablet by mouth daily.     omeprazole (PRILOSEC) 40 MG capsule TAKE 1 CAPSULE(40 MG) BY MOUTH EVERY DAY 20 TO 30 MINUTES BEFORE BREAKFAST     No current facility-administered medications for this visit.    OBJECTIVE: Vitals:   02/12/24 0932  BP: 136/86  Pulse: 84  Resp: 18  Temp: 99 F (37.2 C)  SpO2: 99%     Body mass index is 24.83 kg/m.    ECOG FS:0 - Asymptomatic  General: Well-developed, well-nourished, no acute distress. Eyes: Pink  conjunctiva, anicteric sclera. HEENT: Normocephalic, moist mucous membranes. Lungs: No audible wheezing or coughing. Heart: Regular rate and rhythm. Abdomen: Soft, nontender, no obvious distention. Musculoskeletal: No edema, cyanosis, or clubbing. Neuro: Alert, answering all questions appropriately. Cranial nerves grossly intact. Skin: No rashes or petechiae noted. Psych: Normal affect.   LAB RESULTS:  No results found for: NA, K, CL, CO2, GLUCOSE, BUN, CREATININE, CALCIUM, PROT, ALBUMIN, AST, ALT, ALKPHOS, BILITOT, GFRNONAA, GFRAA  Lab Results  Component Value Date   WBC 6.2 02/12/2024   NEUTROABS 3.4 02/12/2024   HGB 13.4 02/12/2024   HCT 38.9 (L) 02/12/2024   MCV 91.5 02/12/2024   PLT 162 02/12/2024   Lab Results  Component Value Date   IRON  89 02/12/2024   TIBC 328 02/12/2024   IRONPCTSAT 27 02/12/2024   Lab Results  Component Value Date   FERRITIN 13 (L) 02/12/2024     STUDIES: No results found.  ASSESSMENT: Mild iron  deficiency.  PLAN:    Mild iron  deficiency: Although patient's hemoglobin is within normal limits, his ferritin has declined to 13 and he is mildly symptomatic.  Previously, all of his other laboratory work was either negative or within normal limits.  After discussion with the patient, it was agreed upon to pursue 1 infusion of 200 mg IV Venofer  today.  He does not require additional treatments.  Return to clinic in 3 months for repeat laboratory work, further evaluation, and continuation of treatment if necessary. Psoriatic arthritis: Continue methotrexate as prescribed.   I spent a total of 30 minutes reviewing chart data, face-to-face evaluation with the patient, counseling and coordination of care as detailed above.   Patient expressed understanding and was in agreement with this plan. He also understands that He can call clinic at any time with any questions, concerns, or complaints.    Todd JINNY Reusing,  MD   02/13/2024 6:19 AM

## 2024-02-13 ENCOUNTER — Encounter: Payer: Self-pay | Admitting: Oncology

## 2024-02-13 ENCOUNTER — Other Ambulatory Visit: Payer: Self-pay | Admitting: *Deleted

## 2024-02-13 DIAGNOSIS — D509 Iron deficiency anemia, unspecified: Secondary | ICD-10-CM

## 2024-03-11 ENCOUNTER — Inpatient Hospital Stay: Attending: Oncology

## 2024-03-11 DIAGNOSIS — E611 Iron deficiency: Secondary | ICD-10-CM | POA: Diagnosis not present

## 2024-03-11 DIAGNOSIS — D509 Iron deficiency anemia, unspecified: Secondary | ICD-10-CM

## 2024-03-11 LAB — CBC WITH DIFFERENTIAL/PLATELET
Abs Immature Granulocytes: 0.02 K/uL (ref 0.00–0.07)
Basophils Absolute: 0 K/uL (ref 0.0–0.1)
Basophils Relative: 1 %
Eosinophils Absolute: 0.1 K/uL (ref 0.0–0.5)
Eosinophils Relative: 2 %
HCT: 42.7 % (ref 39.0–52.0)
Hemoglobin: 14.4 g/dL (ref 13.0–17.0)
Immature Granulocytes: 0 %
Lymphocytes Relative: 34 %
Lymphs Abs: 2.1 K/uL (ref 0.7–4.0)
MCH: 30.6 pg (ref 26.0–34.0)
MCHC: 33.7 g/dL (ref 30.0–36.0)
MCV: 90.9 fL (ref 80.0–100.0)
Monocytes Absolute: 0.5 K/uL (ref 0.1–1.0)
Monocytes Relative: 8 %
Neutro Abs: 3.4 K/uL (ref 1.7–7.7)
Neutrophils Relative %: 55 %
Platelets: 188 K/uL (ref 150–400)
RBC: 4.7 MIL/uL (ref 4.22–5.81)
RDW: 13.1 % (ref 11.5–15.5)
WBC: 6.2 K/uL (ref 4.0–10.5)
nRBC: 0 % (ref 0.0–0.2)

## 2024-03-11 LAB — FERRITIN: Ferritin: 51 ng/mL (ref 24–336)

## 2024-03-11 LAB — IRON AND TIBC
Iron: 112 ug/dL (ref 45–182)
Saturation Ratios: 37 % (ref 17.9–39.5)
TIBC: 302 ug/dL (ref 250–450)
UIBC: 190 ug/dL

## 2024-03-12 ENCOUNTER — Inpatient Hospital Stay

## 2024-05-14 ENCOUNTER — Inpatient Hospital Stay: Attending: Oncology

## 2024-05-14 DIAGNOSIS — Z8052 Family history of malignant neoplasm of bladder: Secondary | ICD-10-CM | POA: Insufficient documentation

## 2024-05-14 DIAGNOSIS — D509 Iron deficiency anemia, unspecified: Secondary | ICD-10-CM | POA: Diagnosis present

## 2024-05-14 DIAGNOSIS — Z79899 Other long term (current) drug therapy: Secondary | ICD-10-CM | POA: Insufficient documentation

## 2024-05-14 DIAGNOSIS — L4050 Arthropathic psoriasis, unspecified: Secondary | ICD-10-CM | POA: Diagnosis not present

## 2024-05-14 DIAGNOSIS — D649 Anemia, unspecified: Secondary | ICD-10-CM

## 2024-05-14 LAB — IRON AND TIBC
Iron: 111 ug/dL (ref 45–182)
Saturation Ratios: 39 % (ref 17.9–39.5)
TIBC: 281 ug/dL (ref 250–450)
UIBC: 170 ug/dL

## 2024-05-14 LAB — CBC WITH DIFFERENTIAL/PLATELET
Abs Immature Granulocytes: 0.01 K/uL (ref 0.00–0.07)
Basophils Absolute: 0 K/uL (ref 0.0–0.1)
Basophils Relative: 1 %
Eosinophils Absolute: 0.2 K/uL (ref 0.0–0.5)
Eosinophils Relative: 4 %
HCT: 39 % (ref 39.0–52.0)
Hemoglobin: 13.1 g/dL (ref 13.0–17.0)
Immature Granulocytes: 0 %
Lymphocytes Relative: 42 %
Lymphs Abs: 2.2 K/uL (ref 0.7–4.0)
MCH: 30.8 pg (ref 26.0–34.0)
MCHC: 33.6 g/dL (ref 30.0–36.0)
MCV: 91.5 fL (ref 80.0–100.0)
Monocytes Absolute: 0.5 K/uL (ref 0.1–1.0)
Monocytes Relative: 9 %
Neutro Abs: 2.3 K/uL (ref 1.7–7.7)
Neutrophils Relative %: 44 %
Platelets: 190 K/uL (ref 150–400)
RBC: 4.26 MIL/uL (ref 4.22–5.81)
RDW: 13.1 % (ref 11.5–15.5)
WBC: 5.2 K/uL (ref 4.0–10.5)
nRBC: 0 % (ref 0.0–0.2)

## 2024-05-14 LAB — FERRITIN: Ferritin: 91 ng/mL (ref 24–336)

## 2024-05-15 ENCOUNTER — Encounter: Payer: Self-pay | Admitting: Oncology

## 2024-05-15 ENCOUNTER — Inpatient Hospital Stay: Admitting: Oncology

## 2024-05-15 ENCOUNTER — Inpatient Hospital Stay

## 2024-05-15 VITALS — BP 114/72 | HR 93 | Temp 98.9°F | Resp 19 | Ht 71.0 in | Wt 178.6 lb

## 2024-05-15 DIAGNOSIS — D509 Iron deficiency anemia, unspecified: Secondary | ICD-10-CM | POA: Diagnosis not present

## 2024-05-15 NOTE — Progress Notes (Unsigned)
 Patient has some concerns he would like to address today.

## 2024-05-15 NOTE — Progress Notes (Unsigned)
 [facility]  [phone] [phone]  ID: Todd Patrick OB: 12-04-1945  MR#: [id number]  RDW#:[id number]  Patient Care Team: Todd Bail, MD as PCP - General (Internal Medicine) Todd Todd PARAS, MD as Consulting Physician (Oncology)  CHIEF COMPLAINT: Mild iron  deficiency anemia.  INTERVAL HISTORY: Patient last seen in clinic on December 01, 2021.  He is referred back for declining iron  stores.  He feels fatigued, but otherwise feels well. He has no neurologic complaints.  He denies any recent fevers or illnesses.  He has a good appetite and denies weight loss.  He has no chest pain, shortness of breath, cough, or hemoptysis.  She denies any nausea, vomiting, constipation, or diarrhea.  He has no melena or hematochezia.  He has no urinary complaints.  Patient offers no further specific complaints today.  REVIEW OF SYSTEMS:   Review of Systems  Constitutional:  Positive for malaise/fatigue. Negative for fever and weight loss.  Respiratory: Negative.  Negative for cough, hemoptysis and shortness of breath.   Cardiovascular: Negative.  Negative for chest pain and leg swelling.  Gastrointestinal: Negative.  Negative for abdominal pain.  Genitourinary: Negative.  Negative for dysuria.  Musculoskeletal: Negative.  Negative for back pain.  Skin: Negative.  Negative for rash.  Neurological: Negative.  Negative for dizziness, focal weakness, weakness and headaches.  Psychiatric/Behavioral: Negative.  The patient is not nervous/anxious.     As per HPI. Otherwise, a complete review of systems is negative.  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   Ulcerative colitis (HCC)     PAST SURGICAL HISTORY: Past Surgical History:  Procedure Laterality Date   COLONOSCOPY W/ BIOPSIES  06/15/2020   Enthesopathy of right hip  03/22/2021   ESOPHAGOGASTRODUODENOSCOPY  06/15/2020   history of adenomatous colonic polyps  05/18/2020   Rectal fissure repair     RETROPUBIC PROSTATECTOMY      FAMILY HISTORY: Family History  Problem Relation Age of Onset   Diabetes Mother    Heart disease Mother    Arthritis Mother    Cancer Father        bladder cancer   Diabetes Brother    Cancer Brother        Cancer of pharynx   Arthritis Maternal Grandmother    Arthritis Maternal Grandfather    Arthritis Paternal Grandmother    Arthritis Paternal Grandfather     ADVANCED DIRECTIVES (Y/N):  N  HEALTH MAINTENANCE: Social History   Tobacco Use   Smoking status: Never   Smokeless tobacco: Never  Substance Use Topics   Alcohol use: Not Currently   Drug use: Never     Colonoscopy:  PAP:  Bone density:  Lipid panel:  No Known Allergies  Current Outpatient Medications  Medication Sig Dispense Refill   amLODipine (NORVASC) 10 MG tablet Take 1 tablet by mouth daily.     atorvastatin (LIPITOR) 20 MG tablet Take 1 tablet by mouth daily.     famotidine (PEPCID) 20 MG tablet      mesalamine (LIALDA) 1.2 g EC tablet Take by mouth.     metFORMIN (GLUCOPHAGE) 1000 MG tablet Take by mouth.  Multiple Vitamin (MULTI-VITAMIN) tablet Take 1 tablet by mouth daily.     omeprazole (PRILOSEC) 40 MG capsule TAKE 1 CAPSULE(40 MG) BY MOUTH EVERY DAY 20 TO 30 MINUTES BEFORE BREAKFAST     esomeprazole (NEXIUM) 40 MG capsule Take 40 mg by mouth.     methotrexate (RHEUMATREX) 2.5 MG tablet Take by mouth. (Patient not taking: Reported on 05/15/2024)     No current facility-administered medications for this visit.    OBJECTIVE: Vitals:   05/15/24 1030  BP: 114/72  Pulse: 93  Resp: 19  Temp: 98.9 F (37.2 C)  SpO2: 98%      Body mass index is 24.91 kg/m.    ECOG FS:0 - Asymptomatic  General: Well-developed, well-nourished, no acute distress. Eyes: Pink conjunctiva, anicteric sclera. HEENT: Normocephalic, moist mucous membranes. Lungs: No audible wheezing or coughing. Heart: Regular rate and rhythm. Abdomen: Soft, nontender, no obvious distention. Musculoskeletal: No edema, cyanosis, or clubbing. Neuro: Alert, answering all questions appropriately. Cranial nerves grossly intact. Skin: No rashes or petechiae noted. Psych: Normal affect.   LAB RESULTS:  No results found for: NA, K, CL, CO2, GLUCOSE, BUN, CREATININE, CALCIUM, PROT, ALBUMIN, AST, ALT, ALKPHOS, BILITOT, GFRNONAA, GFRAA  Lab Results  Component Value Date   WBC 5.2 05/14/2024   NEUTROABS 2.3 05/14/2024   HGB 13.1 05/14/2024   HCT 39.0 05/14/2024   MCV 91.5 05/14/2024   PLT 190 05/14/2024   Lab Results  Component Value Date   IRON  111 05/14/2024   TIBC 281 05/14/2024   IRONPCTSAT 39 05/14/2024   Lab Results  Component Value Date   FERRITIN 91 05/14/2024     STUDIES: No results found.  ASSESSMENT: Mild iron  deficiency.  PLAN:    Mild iron  deficiency: Although patient's hemoglobin is within normal limits, his ferritin has declined to 13 and he is mildly symptomatic.  Previously, all of his other laboratory work was either negative or within normal limits.  After discussion with the patient, it was agreed upon to pursue 1 infusion of 200 mg IV Venofer  today.  He does not require additional treatments.  Return to clinic in 3 months for repeat laboratory work, further evaluation, and continuation of treatment if necessary. Psoriatic arthritis: Continue methotrexate as prescribed.   I spent a total of 30 minutes reviewing chart data, face-to-face evaluation with the patient, counseling and coordination of care as detailed above.   Patient expressed understanding and was in agreement with this plan.  He also understands that He can call clinic at any time with any questions, concerns, or complaints.    Todd JINNY Reusing, MD   05/15/2024 10:47 AM

## 2024-05-16 ENCOUNTER — Encounter: Payer: Self-pay | Admitting: Oncology

## 2024-05-26 ENCOUNTER — Other Ambulatory Visit: Payer: Self-pay | Admitting: Internal Medicine

## 2024-05-26 ENCOUNTER — Ambulatory Visit
Admission: RE | Admit: 2024-05-26 | Discharge: 2024-05-26 | Disposition: A | Discharge status: Home / Self Care | Source: Ambulatory Visit | Attending: Internal Medicine | Admitting: Internal Medicine

## 2024-05-26 DIAGNOSIS — R42 Dizziness and giddiness: Secondary | ICD-10-CM

## 2024-05-26 MED ORDER — GADOBUTROL 1 MMOL/ML IV SOLN
8.0000 mL | Freq: Once | INTRAVENOUS | Status: AC | PRN
  Administered 2024-05-26: 8 mL via INTRAVENOUS

## 2024-11-12 ENCOUNTER — Inpatient Hospital Stay

## 2024-11-13 ENCOUNTER — Inpatient Hospital Stay

## 2024-11-13 ENCOUNTER — Inpatient Hospital Stay: Admitting: Oncology
